# Patient Record
Sex: Female | Born: 1955
Health system: Southern US, Community
[De-identification: ages and names within clinical notes are randomized; demographics above are authoritative.]

## PROBLEM LIST (undated history)

## (undated) DIAGNOSIS — R569 Unspecified convulsions: Secondary | ICD-10-CM

## (undated) DIAGNOSIS — G43909 Migraine, unspecified, not intractable, without status migrainosus: Secondary | ICD-10-CM

## (undated) DIAGNOSIS — J449 Chronic obstructive pulmonary disease, unspecified: Secondary | ICD-10-CM

## (undated) DIAGNOSIS — S098XXA Other specified injuries of head, initial encounter: Secondary | ICD-10-CM

## (undated) HISTORY — DX: Chronic obstructive pulmonary disease, unspecified: J44.9

## (undated) HISTORY — DX: Migraine, unspecified, not intractable, without status migrainosus: G43.909

## (undated) HISTORY — DX: Unspecified convulsions: R56.9

## (undated) HISTORY — PX: ABDOMINAL HYSTERECTOMY: SHX81

## (undated) HISTORY — PX: TONSILLECTOMY: SUR1361

## (undated) HISTORY — DX: Other specified injuries of head, initial encounter: S09.8XXA

---

## 1988-08-02 DIAGNOSIS — S098XXA Other specified injuries of head, initial encounter: Secondary | ICD-10-CM

## 1988-08-02 HISTORY — DX: Other specified injuries of head, initial encounter: S09.8XXA

## 2009-07-28 ENCOUNTER — Ambulatory Visit: Payer: Self-pay | Admitting: Internal Medicine

## 2011-05-01 ENCOUNTER — Ambulatory Visit: Payer: Self-pay | Admitting: Internal Medicine

## 2011-09-18 ENCOUNTER — Emergency Department: Payer: Self-pay | Admitting: Emergency Medicine

## 2012-05-22 ENCOUNTER — Emergency Department: Payer: Self-pay | Admitting: Emergency Medicine

## 2012-05-22 LAB — COMPREHENSIVE METABOLIC PANEL
Albumin: 3.7 g/dL (ref 3.4–5.0)
Anion Gap: 7 (ref 7–16)
Bilirubin,Total: 0.4 mg/dL (ref 0.2–1.0)
Calcium, Total: 8.6 mg/dL (ref 8.5–10.1)
Chloride: 109 mmol/L — ABNORMAL HIGH (ref 98–107)
Co2: 29 mmol/L (ref 21–32)
Creatinine: 0.74 mg/dL (ref 0.60–1.30)
EGFR (African American): >60
EGFR (Non-African Amer.): >60
Glucose: 110 mg/dL — ABNORMAL HIGH (ref 65–99)
Osmolality: 288 (ref 275–301)
Potassium: 4 mmol/L (ref 3.5–5.1)
Sodium: 145 mmol/L (ref 136–145)

## 2012-05-22 LAB — CBC
HGB: 14.2 g/dL (ref 12.0–16.0)
MCH: 30.7 pg (ref 26.0–34.0)
MCHC: 33.5 g/dL (ref 32.0–36.0)
MCV: 92 fL (ref 80–100)
Platelet: 363 10*3/uL (ref 150–440)
RBC: 4.63 10*6/uL (ref 3.80–5.20)
RDW: 13.5 % (ref 11.5–14.5)
WBC: 10.2 10*3/uL (ref 3.6–11.0)

## 2012-05-22 LAB — URINALYSIS, COMPLETE
Bilirubin,UR: NEGATIVE
Blood: NEGATIVE
Glucose,UR: NEGATIVE mg/dL (ref 0–75)
Ketone: NEGATIVE
Leukocyte Esterase: NEGATIVE
Squamous Epithelial: <1

## 2012-05-22 LAB — LIPASE, BLOOD: Lipase: 211 U/L (ref 73–393)

## 2012-06-05 ENCOUNTER — Ambulatory Visit: Payer: Self-pay | Admitting: Gastroenterology

## 2012-06-07 LAB — PATHOLOGY REPORT

## 2012-06-18 ENCOUNTER — Emergency Department: Payer: Self-pay | Admitting: Emergency Medicine

## 2012-06-18 LAB — URINALYSIS, COMPLETE
Blood: NEGATIVE
Glucose,UR: NEGATIVE mg/dL (ref 0–75)
Ketone: NEGATIVE
Nitrite: NEGATIVE
Protein: NEGATIVE
RBC,UR: <1 /HPF (ref 0–5)
Specific Gravity: 1.004 (ref 1.003–1.030)
Squamous Epithelial: <1

## 2012-06-18 LAB — COMPREHENSIVE METABOLIC PANEL
Albumin: 3.8 g/dL (ref 3.4–5.0)
Anion Gap: 5 — ABNORMAL LOW (ref 7–16)
BUN: 8 mg/dL (ref 7–18)
Calcium, Total: 8.9 mg/dL (ref 8.5–10.1)
Co2: 28 mmol/L (ref 21–32)
EGFR (Non-African Amer.): >60
Glucose: 99 mg/dL (ref 65–99)
Potassium: 3.9 mmol/L (ref 3.5–5.1)
SGOT(AST): 21 U/L (ref 15–37)
SGPT (ALT): 19 U/L (ref 12–78)
Total Protein: 6.6 g/dL (ref 6.4–8.2)

## 2012-06-18 LAB — ETHANOL
Ethanol %: <0.003 % (ref 0.000–0.080)
Ethanol: <3 mg/dL

## 2012-06-18 LAB — DRUG SCREEN, URINE
Amphetamines, Ur Screen: NEGATIVE (ref ?–1000)
Benzodiazepine, Ur Scrn: POSITIVE (ref ?–200)
Cannabinoid 50 Ng, Ur ~~LOC~~: NEGATIVE (ref ?–50)
MDMA (Ecstasy)Ur Screen: NEGATIVE (ref ?–500)
Opiate, Ur Screen: NEGATIVE (ref ?–300)
Tricyclic, Ur Screen: NEGATIVE (ref ?–1000)

## 2012-06-18 LAB — CBC
HGB: 13.6 g/dL (ref 12.0–16.0)
MCH: 31.3 pg (ref 26.0–34.0)
MCHC: 34.1 g/dL (ref 32.0–36.0)
Platelet: 317 10*3/uL (ref 150–440)
RBC: 4.35 10*6/uL (ref 3.80–5.20)
RDW: 13.2 % (ref 11.5–14.5)
WBC: 8.6 10*3/uL (ref 3.6–11.0)

## 2012-06-18 LAB — TROPONIN I: Troponin-I: <0.02 ng/mL

## 2013-03-03 ENCOUNTER — Emergency Department: Payer: Self-pay | Admitting: Emergency Medicine

## 2013-03-03 LAB — URINALYSIS, COMPLETE
Glucose,UR: NEGATIVE mg/dL (ref 0–75)
Ketone: NEGATIVE
Leukocyte Esterase: NEGATIVE
Nitrite: NEGATIVE
Ph: 8 (ref 4.5–8.0)
Protein: NEGATIVE
Specific Gravity: 1.003 (ref 1.003–1.030)
Squamous Epithelial: 1
WBC UR: NONE SEEN /HPF (ref 0–5)

## 2013-03-03 LAB — COMPREHENSIVE METABOLIC PANEL
Albumin: 3.6 g/dL (ref 3.4–5.0)
Anion Gap: 5 — ABNORMAL LOW (ref 7–16)
Chloride: 107 mmol/L (ref 98–107)
Co2: 28 mmol/L (ref 21–32)
Creatinine: 0.65 mg/dL (ref 0.60–1.30)
EGFR (African American): >60
EGFR (Non-African Amer.): >60
Glucose: 89 mg/dL (ref 65–99)
Potassium: 3.8 mmol/L (ref 3.5–5.1)
SGOT(AST): 18 U/L (ref 15–37)
Total Protein: 6.9 g/dL (ref 6.4–8.2)

## 2013-03-03 LAB — CBC
HCT: 44.5 % (ref 35.0–47.0)
MCV: 90 fL (ref 80–100)
Platelet: 303 10*3/uL (ref 150–440)
RBC: 4.92 10*6/uL (ref 3.80–5.20)
RDW: 13.2 % (ref 11.5–14.5)
WBC: 14.3 10*3/uL — ABNORMAL HIGH (ref 3.6–11.0)

## 2013-03-07 ENCOUNTER — Inpatient Hospital Stay: Payer: Self-pay | Admitting: Student

## 2013-03-07 LAB — COMPREHENSIVE METABOLIC PANEL
Albumin: 3.6 g/dL (ref 3.4–5.0)
Alkaline Phosphatase: 111 U/L (ref 50–136)
BUN: 8 mg/dL (ref 7–18)
Bilirubin,Total: 0.4 mg/dL (ref 0.2–1.0)
Calcium, Total: 9.5 mg/dL (ref 8.5–10.1)
EGFR (Non-African Amer.): >60
Glucose: 91 mg/dL (ref 65–99)
Osmolality: 272 (ref 275–301)
Potassium: 3.6 mmol/L (ref 3.5–5.1)
SGOT(AST): 16 U/L (ref 15–37)
Sodium: 137 mmol/L (ref 136–145)
Total Protein: 7.3 g/dL (ref 6.4–8.2)

## 2013-03-07 LAB — CBC
HCT: 47.2 % — ABNORMAL HIGH (ref 35.0–47.0)
MCH: 31.4 pg (ref 26.0–34.0)
MCHC: 34.7 g/dL (ref 32.0–36.0)
MCV: 91 fL (ref 80–100)
Platelet: 302 10*3/uL (ref 150–440)
RBC: 5.21 10*6/uL — ABNORMAL HIGH (ref 3.80–5.20)
WBC: 9.5 10*3/uL (ref 3.6–11.0)

## 2013-03-07 LAB — DRUG SCREEN, URINE
Amphetamines, Ur Screen: NEGATIVE (ref ?–1000)
Barbiturates, Ur Screen: NEGATIVE (ref ?–200)
MDMA (Ecstasy)Ur Screen: NEGATIVE (ref ?–500)
Methadone, Ur Screen: NEGATIVE (ref ?–300)
Opiate, Ur Screen: POSITIVE (ref ?–300)

## 2013-03-07 LAB — ETHANOL
Ethanol %: <0.003 % (ref 0.000–0.080)
Ethanol: <3 mg/dL

## 2013-03-07 LAB — URINALYSIS, COMPLETE
Bilirubin,UR: NEGATIVE
Blood: NEGATIVE
Ph: 6 (ref 4.5–8.0)
RBC,UR: 1 /HPF (ref 0–5)
Squamous Epithelial: 1

## 2013-03-07 LAB — VALPROIC ACID LEVEL: Valproic Acid: 5 ug/mL — ABNORMAL LOW

## 2013-03-08 LAB — VALPROIC ACID LEVEL: Valproic Acid: 45 ug/mL — ABNORMAL LOW

## 2013-03-20 ENCOUNTER — Emergency Department: Payer: Self-pay | Admitting: Emergency Medicine

## 2013-03-20 LAB — CBC WITH DIFFERENTIAL/PLATELET
Basophil #: 0.1 10*3/uL (ref 0.0–0.1)
Basophil %: 0.7 %
Eosinophil %: 6.1 %
HCT: 39.2 % (ref 35.0–47.0)
Lymphocyte %: 38 %
MCHC: 34.4 g/dL (ref 32.0–36.0)
Monocyte %: 6.8 %
Neutrophil #: 4.8 10*3/uL (ref 1.4–6.5)
Neutrophil %: 48.4 %
Platelet: 278 10*3/uL (ref 150–440)
RBC: 4.36 10*6/uL (ref 3.80–5.20)
RDW: 12.7 % (ref 11.5–14.5)
WBC: 10 10*3/uL (ref 3.6–11.0)

## 2013-03-20 LAB — BASIC METABOLIC PANEL
Anion Gap: 2 — ABNORMAL LOW (ref 7–16)
BUN: 10 mg/dL (ref 7–18)
Co2: 32 mmol/L (ref 21–32)
Creatinine: 0.64 mg/dL (ref 0.60–1.30)
EGFR (Non-African Amer.): >60
Glucose: 91 mg/dL (ref 65–99)

## 2013-03-20 LAB — VALPROIC ACID LEVEL: Valproic Acid: 59 ug/mL

## 2013-05-23 ENCOUNTER — Ambulatory Visit: Payer: Self-pay | Admitting: Neurology

## 2013-06-20 ENCOUNTER — Emergency Department: Payer: Self-pay | Admitting: Emergency Medicine

## 2013-06-20 LAB — COMPREHENSIVE METABOLIC PANEL
Alkaline Phosphatase: 83 U/L (ref 50–136)
BUN: 4 mg/dL — ABNORMAL LOW (ref 7–18)
Bilirubin,Total: 0.3 mg/dL (ref 0.2–1.0)
Creatinine: 0.62 mg/dL (ref 0.60–1.30)
EGFR (African American): >60
Glucose: 91 mg/dL (ref 65–99)
Osmolality: 268 (ref 275–301)
SGOT(AST): 19 U/L (ref 15–37)
SGPT (ALT): 17 U/L (ref 12–78)
Sodium: 136 mmol/L (ref 136–145)

## 2013-06-20 LAB — CBC WITH DIFFERENTIAL/PLATELET
Basophil #: 0.1 10*3/uL (ref 0.0–0.1)
Basophil %: 0.4 %
Eosinophil #: 0.4 10*3/uL (ref 0.0–0.7)
HCT: 34.8 % — ABNORMAL LOW (ref 35.0–47.0)
Lymphocyte #: 2.8 10*3/uL (ref 1.0–3.6)
MCH: 31 pg (ref 26.0–34.0)
MCHC: 34.4 g/dL (ref 32.0–36.0)
Monocyte #: 1.1 x10 3/mm — ABNORMAL HIGH (ref 0.2–0.9)
Monocyte %: 7.7 %
Neutrophil #: 9.7 10*3/uL — ABNORMAL HIGH (ref 1.4–6.5)
Neutrophil %: 68.8 %
RDW: 13.2 % (ref 11.5–14.5)
WBC: 14 10*3/uL — ABNORMAL HIGH (ref 3.6–11.0)

## 2013-06-29 ENCOUNTER — Ambulatory Visit: Payer: Self-pay

## 2013-07-16 ENCOUNTER — Ambulatory Visit: Payer: Self-pay

## 2013-07-31 ENCOUNTER — Ambulatory Visit: Payer: Self-pay

## 2013-08-21 ENCOUNTER — Ambulatory Visit: Payer: Self-pay

## 2013-09-13 ENCOUNTER — Ambulatory Visit: Payer: Self-pay

## 2013-10-22 ENCOUNTER — Ambulatory Visit: Payer: Self-pay

## 2014-04-16 IMAGING — CT CT ABD-PELV W/ CM
1 of 3 series · 14 of 32 positions shown, 19 images · non-contrast
Comparison: none

REASON FOR EXAM: (1) pain - LLQ, hx of UC and of Diverticulitis; (2) pain
- LLQ, hx of UC and of
COMMENTS:   May transport without cardiac monitor

PROCEDURE:     CT  - CT ABDOMEN / PELVIS  W  - May 22, 2012  [DATE]
RESULT:     History: Pain.
Comparison Study: No recent.

[Series 2: soft tissue · axial · 0.66mm/px · z∈[-893,-530]mm · 14 of 139 slices shown, 19 images]
[im 9/139  soft-tissue]
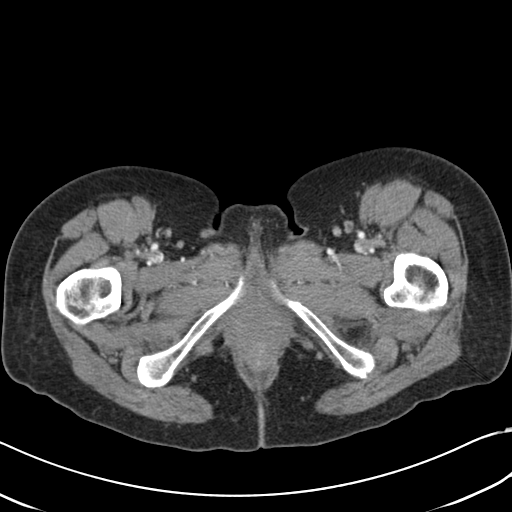
[im 9/139  bone]
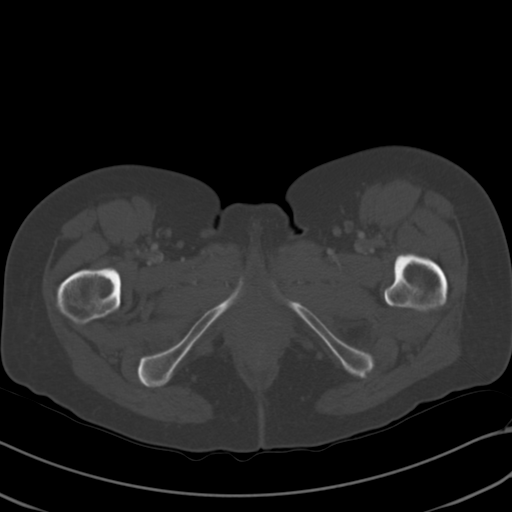
[im 17/139  soft-tissue]
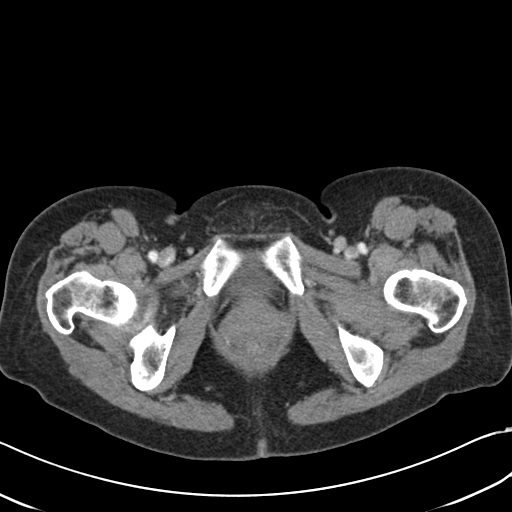
[im 33/139  soft-tissue]
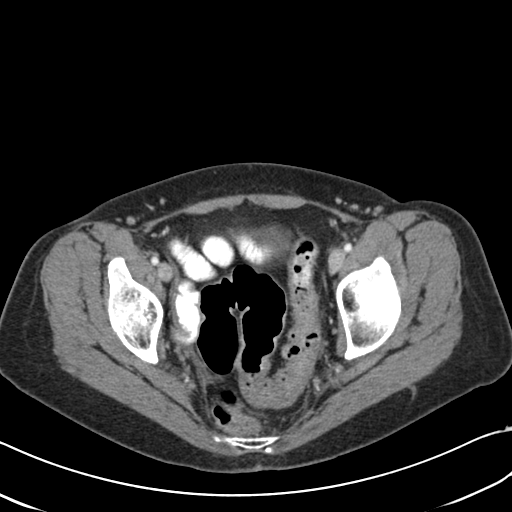
[im 41/139  soft-tissue]
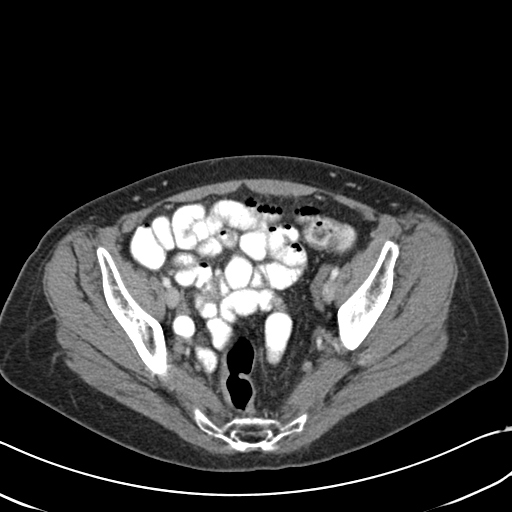
[im 49/139  soft-tissue]
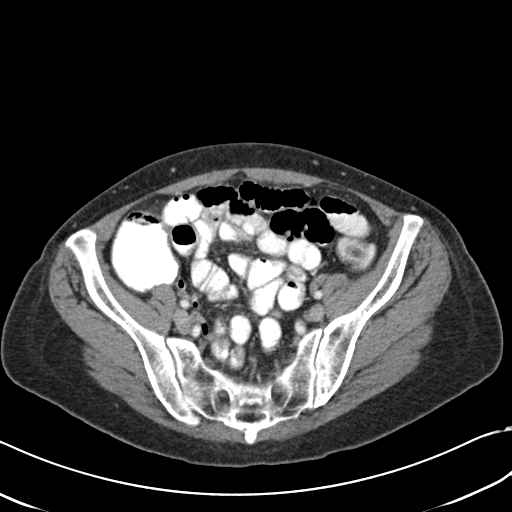
[im 57/139  soft-tissue]
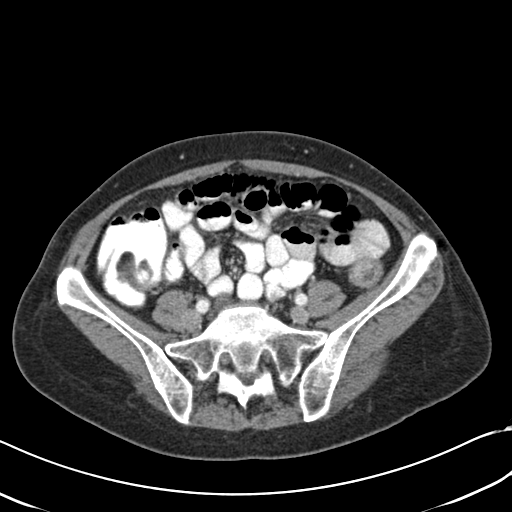
[im 74/139  soft-tissue]
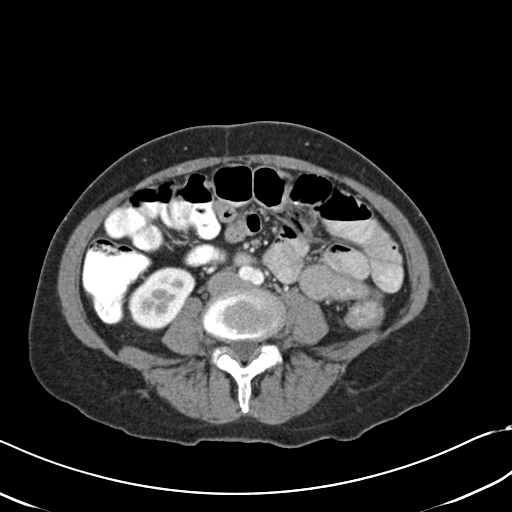
[im 82/139  soft-tissue]
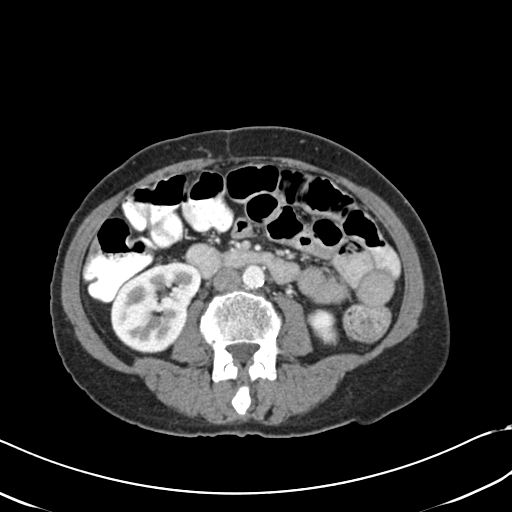
[im 90/139  soft-tissue]
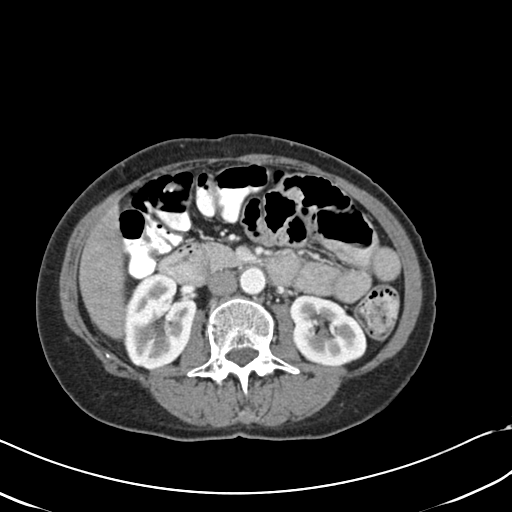
[im 90/139  bone]
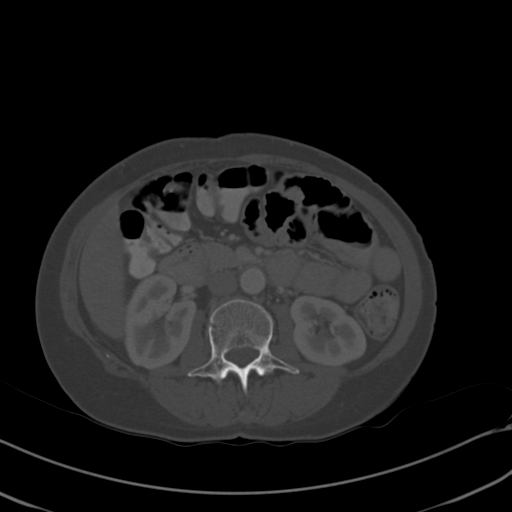
[im 98/139  soft-tissue]
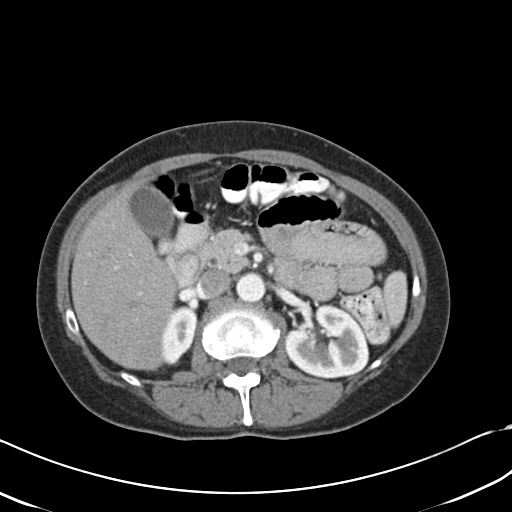
[im 106/139  soft-tissue]
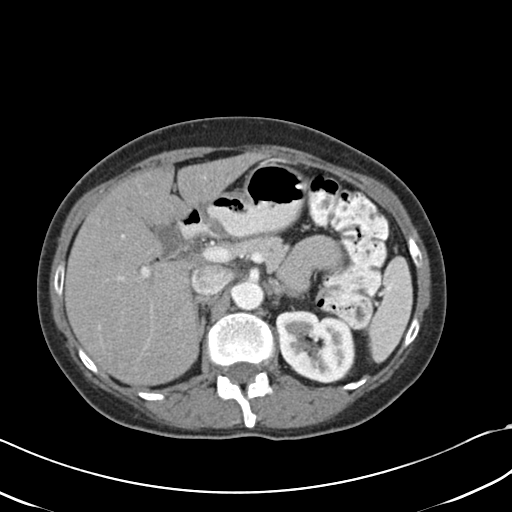
[im 106/139  lung]
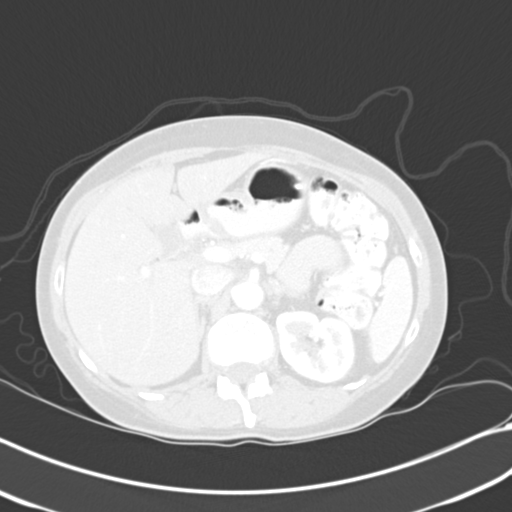
[im 114/139  lung]
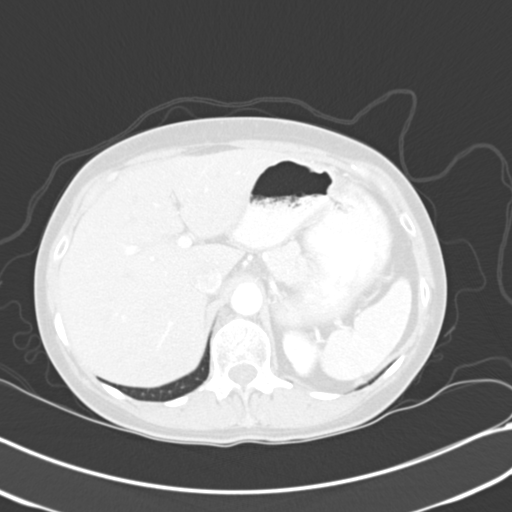
[im 122/139  soft-tissue]
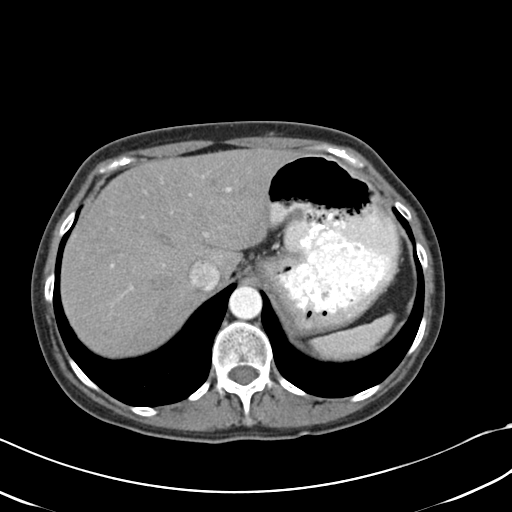
[im 122/139  lung]
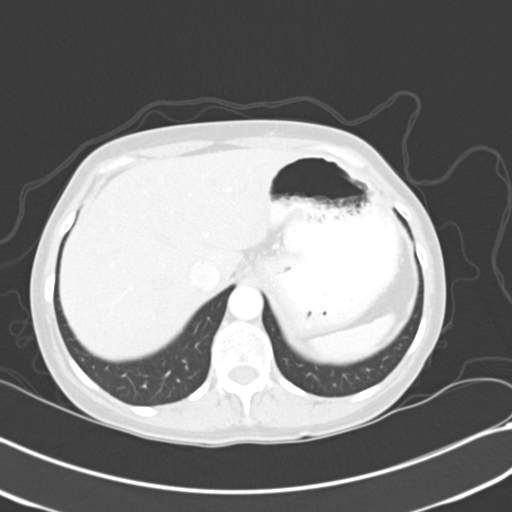
[im 130/139  soft-tissue]
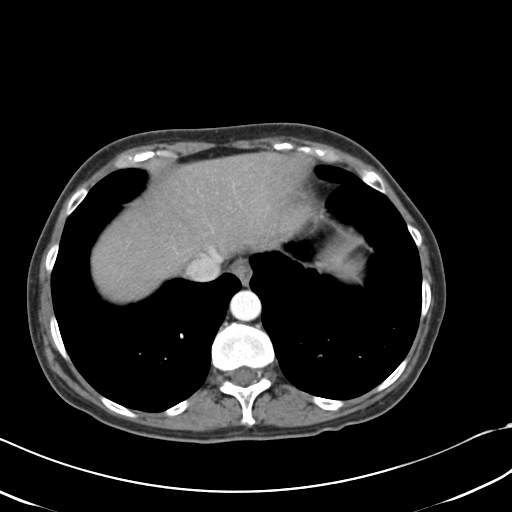
[im 130/139  lung]
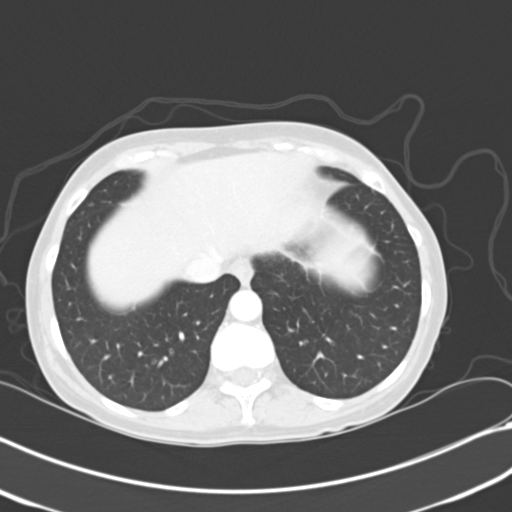

[14 of 32 positions shown; findings below may reference images not displayed]

FINDINGS: Standard CT obtained 85 cc of Isovue 300. Liver normal.
Gallbladder nondistended. Spleen normal. Pancreas normal. Adrenals normal.
Right renal cyst. No hydronephrosis. Bladder slightly distended.
Phleboliths. Mild colonic wall thickening is noted in the sigmoid and left
colon. There is left colonic diverticulosis. Appendix normal. No pathologic
fluid collections are noted. Mild distention of small and large bowel loops
are noted. This may be related to adynamic ileus. No obstructing lesion is
identified. Mesenteric vessels are patent. There is no free air. Lung bases
clear. Aorta unremarkable. Prior hysterectomy.
IMPRESSION: Mild thickening of the left colon and sigmoid colon. Mild
colitis cannot be excluded. Diverticulosis is noted. No mesenteric fat
streaking noted to suggest prominent diverticulitis. There is mild
distention of small bowel and colon suggesting adynamic ileus.

## 2014-11-22 NOTE — H&P (Signed)
PATIENT NAME:  Martha Rivera, Martha Rivera MR#:  [id number] DATE OF BIRTH:  1956/04/27  DATE OF ADMISSION:  03/07/2013  PRIMARY CARE PHYSICIAN: Valetta Close, MD  NEUROLOGIST: None  REFERRING PHYSICIAN: Latina Craver, MD    CHIEF COMPLAINT: Seizures.   HISTORY OF PRESENT ILLNESS: The patient is a pleasant 59 year old Caucasian female with history of ulcerative colitis and a closed head injury 20 years ago or so and resultant seizures. Her seizures, per her, are being taken care of by Dr. Clemmie Krill and she does not see a neurologist but used to see one. Her seizures were less frequent prior but had a seizure a year ago and since the last 6 months has had 2 other seizures besides today. 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 Occasionally she works up to 100 hours per week. She has had some weight loss and has been having anxiety from work. She was at work today and had multiple seizures. EMS was called where she had more seizures at work. She was given some Versed and brought to the hospital where, again, she had some seizures. She was loaded with valproic injection, was also given 2 mg IV of lorazepam. Currently, she is lying in bed and is awake, alert, oriented and is providing history. Her husband is also here. She has had a recent bronchitis and is on her last day of Levaquin. Her cough is persistent, but she is not short of breath, and she has had no fevers.   PAST MEDICAL HISTORY:  1.  Ulcerative colitis.  2.  Closed head injury 20 years ago or so with resultant seizure activity.  3.  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  4.  Hysterectomy.  5.  Neck surgery.   SOCIAL HISTORY:  Positive for tobacco 1/2 pack a day. Drinks beer, 1 to 2 drinks a day and occasionally more over the weekends.  Did also smoke pot.   FAMILY HISTORY: Dad's side of the family with heart issues. Mom's side of the family with colitis issues.   ALLERGIES:  STEROIDS AND AUGMENTIN.  OUTPATIENT MEDICATIONS: Tussionex 5 mL every 12 hours, Depakote Extended Release 250 mg 2 times a day, dicyclomine 10 mg 2 caps 4 times a day as needed, levofloxacin 750 mg, today is her last day, Lexapro 20 mg daily, lorazepam 0.5 mg 1 tab 3 times a day as needed for anxiety and nervousness.   REVIEW OF SYSTEMS: CONSTITUTIONAL: No fever but has overall fatigue and weakness.  Positive for headache.  EYES: No blurry vision or double vision.  ENT: No tinnitus, hearing loss.  RESPIRATORY: Positive for cough and some productive sputum. No hemoptysis. Overall feels better from her bronchitis. No history of COPD. No painful respirations.  CARDIOVASCULAR: No chest pain, orthopnea or swelling in the legs. No hypertension. GASTROINTESTINAL: Has history of ulcerative colitis and chronic abdominal pain but  nothing out of the ordinary this time around. No nausea, vomiting, diarrhea. No bloody stools or dark stools.  GENITOURINARY: Denies dysuria, hematuria.  HEMATOLOGIC/LYMPHATIC: No anemia or easy bruising.  SKIN: No rashes.  MUSCULOSKELETAL: Denies arthritis or gout.  NEUROLOGIC: Seizure activity as above.  Has peripheral neuropathy in upper extremities.  PSYCHIATRIC: No depression but has anxiety.   PHYSICAL EXAMINATION: VITAL SIGNS: Temperature on arrival 97.5, pulse rate 95, respiratory rate 18, blood pressure 16, 97/65, last blood pressure 105/64, O2 sat was 96% on room air.  GENERAL: The patient  is a thin Caucasian female lying in bed in no obvious distress, talking in full sentences.  HEENT: Normocephalic, atraumatic. Pupils are equal and reactive. Anicteric sclerae.  Extraocular muscles intact. Moist mucous membranes.  NECK: Supple. No thyroid tenderness. No cervical lymphadenopathy.  CARDIOVASCULAR: S1, S2, regular rate and rhythm. No murmurs, rubs or gallops.  LUNGS: Clear to auscultation. No wheezing, rhonchi or rales.  ABDOMEN: Soft. Hypoactive bowel sounds in all  quadrants. No organomegaly appreciated. No tenderness or rebound or guarding.  EXTREMITIES: No significant lower extremity edema. There are some tattoos on the skin.   NEUROLOGIC: Cranial nerves II through XII grossly intact. Strength is 5 out of 5 in all extremities. Sensation is intact to light touch.  PSYCHIATRIC: Awake, alert, oriented x 3.   LABORATORY AND DIAGNOSTIC DATA:  BUN 8, creatinine 0.77, sodium 137, potassium 3.6. LFTs within normal limits. Alcohol level below detection. Valproic acid level is 5. U-tox positive for benzos, cannabinoids and opiates. WBC 9.5, hemoglobin 16.4, platelets are 302. UA not suggestive of infection.  EKG: Normal sinus rhythm, possible left atrial enlargement. No acute ST elevations or depressions. Rate is 96, next one 93.   ASSESSMENT AND PLAN: We have a 59 year old with seizures, status post closed head injury, ulcerative colitis who has had more frequent seizures recently with 3 in the last 6 months. The patient, although takes Depakote, the level is low. At this point, would admit the patient to the hospital.  Her seizures and status epilepticus likely are triggered by low levels in the body. She states that she misses her medications 1 to 2 times a week. Also, I believe her stress related to excessive working, her alcohol and marijuana use might also have contributed. At this point, she is neurologically intact. We would increase the Depakote level and add Keppra, obtain a neurology consult, check an EEG. She was counseled that she cannot drive until she is cleared by Neurology. We would also obtain an MRI of the brain. We would place her on seizure precautions and also add Ativan IV p.r.n. for seizure activity. We would consider rechecking the Depakote level. She, of note, has a bronchitis but the symptoms are resolving except for a persistent cough. She has no fever, no shortness of breath. She has no significant electrolyte abnormalities. She was counseled  about her tobacco abuse for 3 minutes in addition to her alcohol abuse and pot use. We would continue her Lexapro and start her on some gentle IV fluids at this point.   TOTAL TIME SPENT: 50 minutes.   CODE STATUS: The patient is FULL CODE.   ____________________________ [name], MD sa:cb D: 03/07/2013 15:53:44 ET T: 03/07/2013 16:11:18 ET JOB#: [id number]  cc: [name], MD, <Dictator> [name] MD ELECTRONICALLY SIGNED 03/15/2013 13:30

## 2014-11-22 NOTE — Discharge Summary (Signed)
PATIENT NAME:  Martha Rivera, CORTESE MR#:  [id number] DATE OF BIRTH:  March 02, 1956  PRIMARY CARE PHYSICIAN: Dr. Bernie Covey.   CHIEF COMPLAINT: Seizure.   DISCHARGE DIAGNOSES:  1.  Status epilepticus, likely multifactorial in the setting of elevated stress, work and personal life-related, recent infection, and not taking Depakote as prescribed with ethanol abuse.  2.  History of seizure disorder.  3.  Ulcerative colitis, chronic.  4.  Recent bronchitis.  5.  History of neuropathy in the upper extremities.  6.  History of closed head injury with resultant seizure activity more than two decades ago.   DISCHARGE MEDICATIONS: Lexapro 20 mg daily, lorazepam 0.5 mg three times a day as needed for anxiety, dicyclomine 10 mg two caps 4 times a day as needed, Divalproex  500 mg delayed release one tab 2 times a day with meals, Keppra 500 mg one tab every 12 hours.   DIET: Regular.   ACTIVITY: As tolerated.   FOLLOWUP: Please follow with PCP within 1 to 2 weeks. Please follow with Dr. Melrose Nakayama at [facility] neurology August 25 at 1:00 p.m. No driving until approved by her neurologist.   DISPOSITION: Home.   SIGNIFICANT LABS AND IMAGING: On August 6, BMP was within normal limits. Alcohol level was less than 0.003%. LFTs are within normal limits. Initial valproic acid level was at 5 and last valproic acid serum was at 45. Urine drug screen was positive for benzodiazepines, cannabinoids, and opiates. Initial WBC was 9.5. Hemoglobin 16.4, platelets of 302. Urinalysis not suggestive of infection. MRI of the brain without contrast showed no acute intracranial abnormality.   HISTORY OF PRESENT ILLNESS AND HOSPITAL COURSE: For full details of H and P, please see the dictation on August 6 by Dr. Bridgette Habermann. In brief, this is a Martha 59 year old Rivera with history of seizures after a closed head injury who is being followed up with Dr. Bernie Covey. She has not seen a neurologist where she was seeing one  before.   She presented with having seizures at work. EMS was called and she had more seizures in the ambulance. She received some Versed and came into the hospital, where again she was having seizure activity and was loaded with Depakote and admitted to the hospitalist service.   By the time I had seen the patient, she was awake, alert, oriented x3 and providing history. The patient had a recent bronchitis and was on Levaquin and some cough medicine. The patient was having to go to work about 100 hours per week six days a week and has a couple of days off every multiple weeks. The patient also described having a stress from a close personal friend who is terminal from metastatic lung cancer.   She had been skipping some of her medications and also has been using alcohol. She came in for the above reason. She was admitted to the hospitalist service with seizure precautions. Her Depakote level was not therapeutic, likely secondary to noncompliance. She was neurologically intact. She underwent an EEG which did not show a seizure focus and underwent an MRI.   We unfortunately do not have neurology coverage this week and have made an appointment for her to see a neurologist as dictated above. She was counseled about tobacco and her alcohol abuse. At this point, she will be discharged with outpatient follow-up.   She was counseled not to drive until approved by her neurologist and counseled against smoking and alcohol use as they could be triggering her seizures  in addition to all the stress that she is having. She was counseled to take her medications as prescribed.   Total time spent is 40 minutes.   ____________________________ [name], MD sa:np D: 03/09/2013 13:39:40 ET T: 03/09/2013 15:21:50 ET JOB#: [id number]  cc: [name], MD, <Dictator> Valetta Close, MD [name] MD ELECTRONICALLY SIGNED 03/15/2013 13:36

## 2015-01-26 IMAGING — CR DG CHEST 2V
1 series · 2 of 2 positions shown · non-contrast
Comparison: none

REASON FOR EXAM: SOB
COMMENTS:

PROCEDURE:     DXR - DXR CHEST PA (OR AP) AND LATERAL  - March 03, 2013  [DATE]
RESULT:     The lungs are clear. The cardiac silhouette and visualized bony
skeleton are unremarkable.

[Series 1: w chest pa · 0.14mm/px · 2 of 2 slices shown]
[im 1/2]
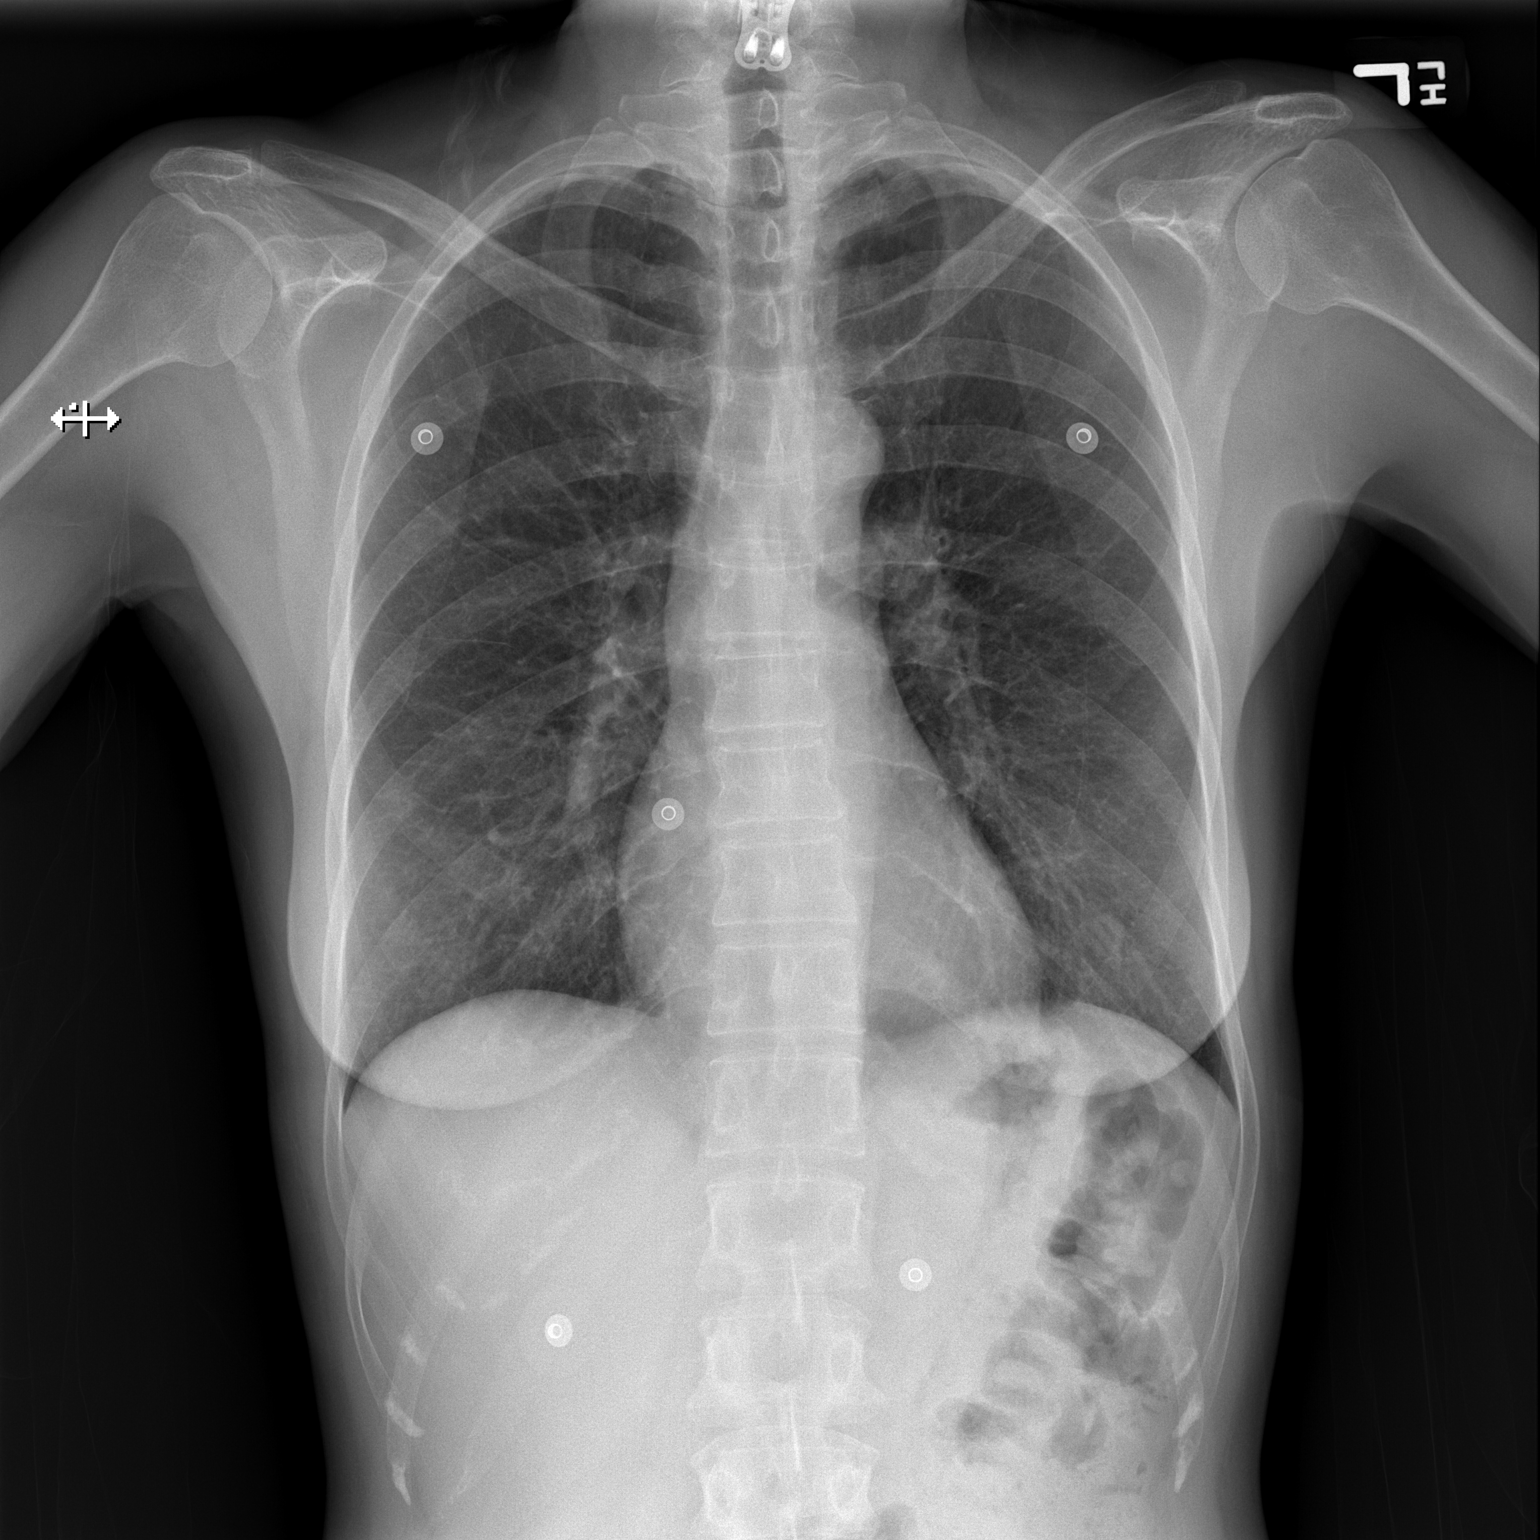
[im 2/2]
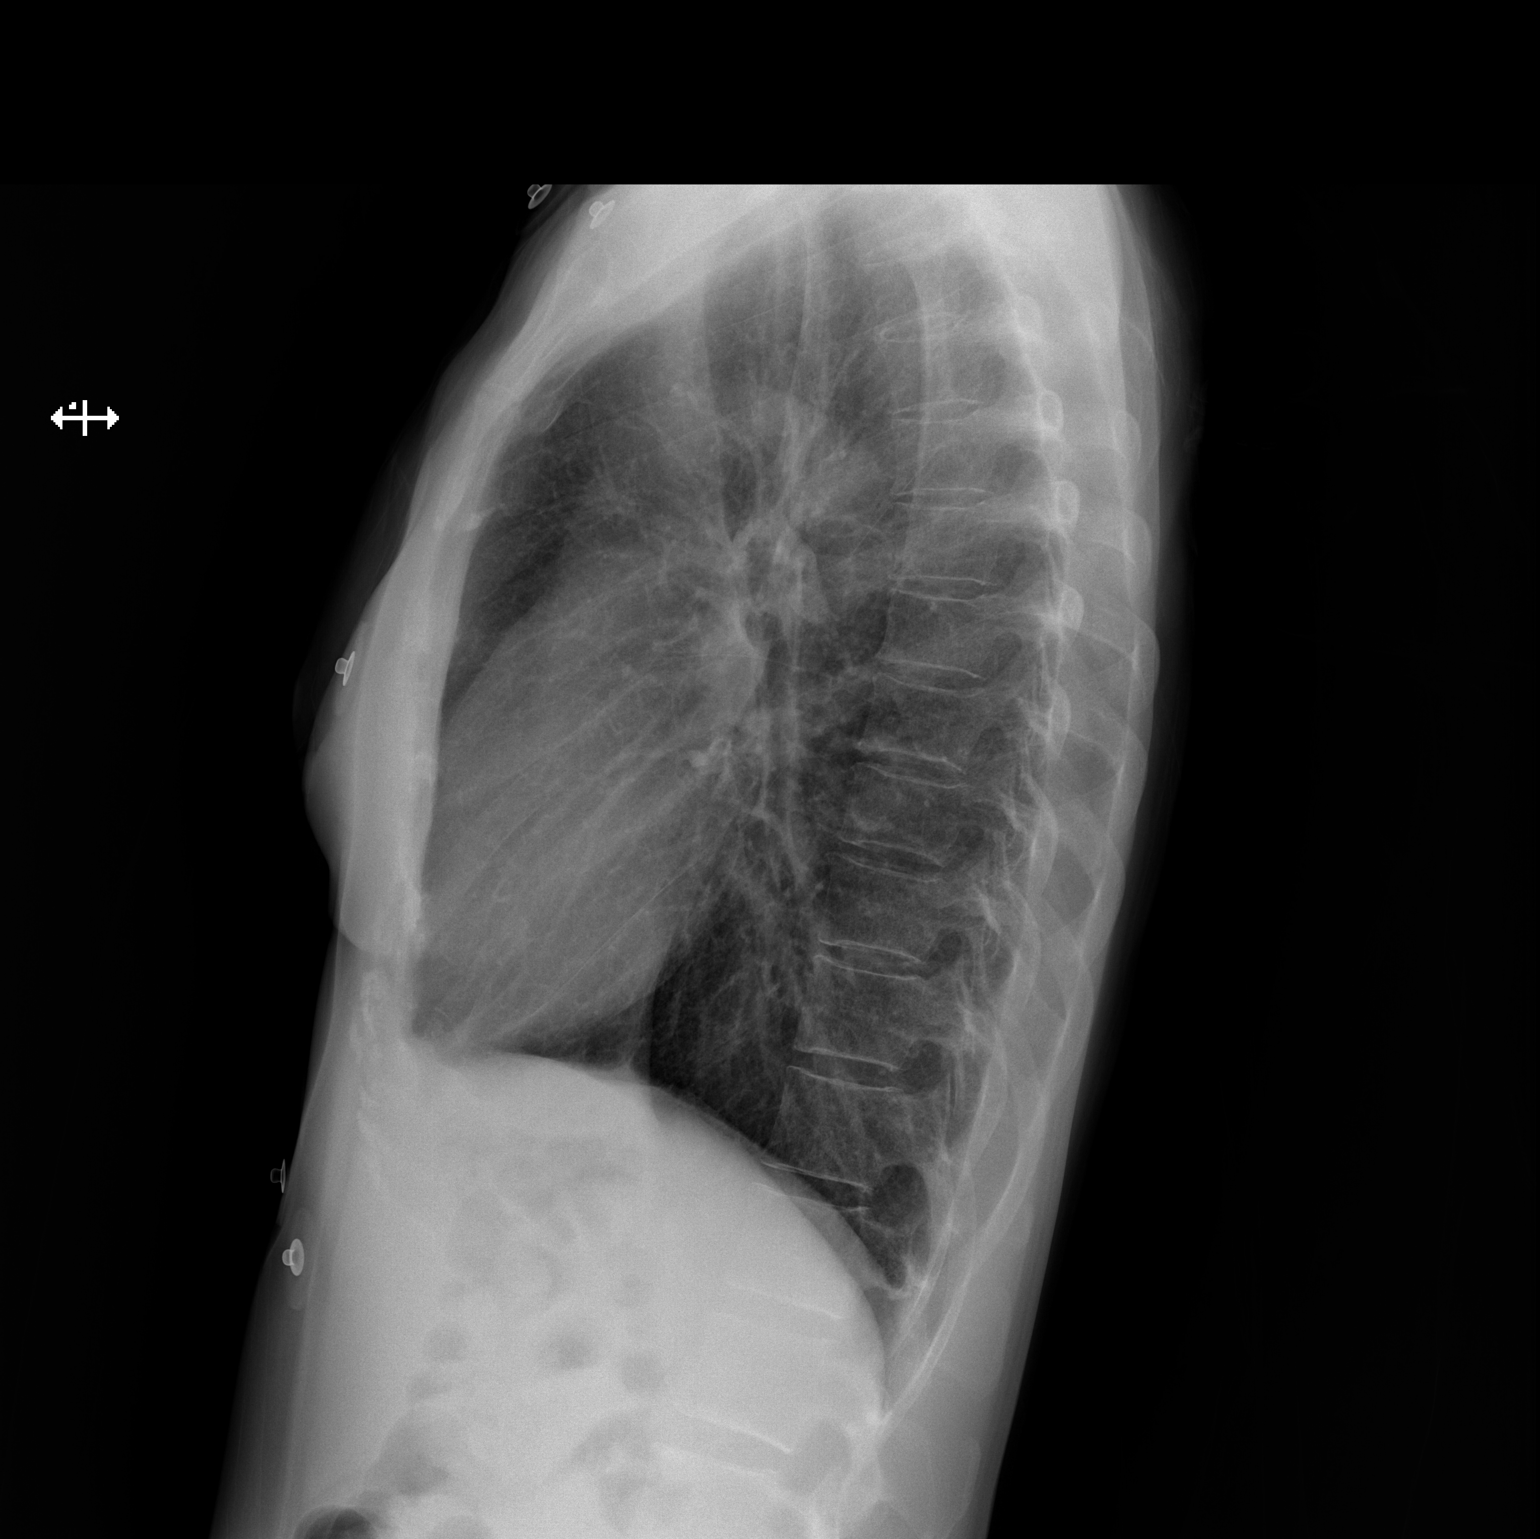

[2 of 2 positions shown; findings below may reference images not displayed]

IMPRESSION: 1. Chest radiograph without evidence of acute cardiopulmonary disease.
2. Comparison made to prior study dated 06/18/2012.

## 2015-10-08 DIAGNOSIS — F419 Anxiety disorder, unspecified: Secondary | ICD-10-CM | POA: Diagnosis not present

## 2015-10-08 DIAGNOSIS — G40909 Epilepsy, unspecified, not intractable, without status epilepticus: Secondary | ICD-10-CM | POA: Diagnosis not present

## 2015-10-08 DIAGNOSIS — G43119 Migraine with aura, intractable, without status migrainosus: Secondary | ICD-10-CM | POA: Diagnosis not present

## 2015-10-08 DIAGNOSIS — R569 Unspecified convulsions: Secondary | ICD-10-CM | POA: Diagnosis not present

## 2015-10-08 DIAGNOSIS — G43719 Chronic migraine without aura, intractable, without status migrainosus: Secondary | ICD-10-CM | POA: Diagnosis not present

## 2015-11-06 ENCOUNTER — Encounter: Payer: Self-pay | Admitting: Obstetrics & Gynecology

## 2015-11-06 ENCOUNTER — Ambulatory Visit (INDEPENDENT_AMBULATORY_CARE_PROVIDER_SITE_OTHER): Payer: Medicare Other | Admitting: Obstetrics & Gynecology

## 2015-11-06 VITALS — BP 109/70 | HR 79 | Resp 18 | Ht 64.0 in | Wt 115.0 lb

## 2015-11-06 DIAGNOSIS — N8110 Cystocele, unspecified: Secondary | ICD-10-CM | POA: Diagnosis not present

## 2015-11-06 DIAGNOSIS — N952 Postmenopausal atrophic vaginitis: Secondary | ICD-10-CM | POA: Diagnosis not present

## 2015-11-06 DIAGNOSIS — N393 Stress incontinence (female) (male): Secondary | ICD-10-CM | POA: Insufficient documentation

## 2015-11-06 DIAGNOSIS — IMO0002 Reserved for concepts with insufficient information to code with codable children: Secondary | ICD-10-CM

## 2015-11-06 MED ORDER — ESTRADIOL 10 MCG VA TABS: ORAL_TABLET | VAGINAL | Status: DC

## 2015-11-06 NOTE — Progress Notes (Signed)
   Subjective:    Patient ID: Martha Rivera, female    DOB: 1956/02/15, 60 y.o.   MRN: [id number]  HPI  60 yo MW P1 here with a 1 month h/o feeling a lot of vaginal discomfort "like there is a tampax in me falling out". She also has pain with sex but this is complicated by her husband's ED. They are rarely sexually active. She also has a year h/o GSUI, denies urge incontinence. She wears pads daily.   Review of Systems She had a TVH/BSO in 2002. 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    Objective:   Physical Exam  WNWNWFNAD Breathing with some audible difficulty (has COPD) Abd- benign VERY small vagina Grade 1 cystocele Severe atrophy      Assessment & Plan:  GSUI- she declines a pessary and is interested in a sling Atrophy- vagifem QOD until I evaluate her in a month Grade 1 cystocele- We discussed a possible cystocele repair but I did caution her that it will make her vagina even smaller.

## 2015-11-06 NOTE — Progress Notes (Signed)
Pt had hysterectomy in 2002, c/o possible bladder prolapse and urinary stress incontinence.

## 2015-11-10 ENCOUNTER — Telehealth: Payer: Self-pay | Admitting: *Deleted

## 2015-11-10 DIAGNOSIS — N952 Postmenopausal atrophic vaginitis: Secondary | ICD-10-CM

## 2015-11-10 MED ORDER — ESTROGENS, CONJUGATED 0.625 MG/GM VA CREA: TOPICAL_CREAM | VAGINAL | Status: AC

## 2015-11-10 NOTE — Telephone Encounter (Signed)
-----   Message from Francia Greaves sent at 11/07/2015  9:26 AM EDT ----- Regarding: Rx Swap  Contact: [phone] Pt called and states that the medicine [facility] prescribed her is over $500 (w/ her discount card), she has Medicare. Wants to know if there is a cheaper option we could give her

## 2015-11-10 NOTE — Telephone Encounter (Signed)
Pt spoke to pharmacy and pharmacy tech stated there was a cream that she could use that would be cheaper than the Estradiol.  Dr Kennon Rounds ordered Premarin cream every other day.  Rx sent to pharmacy, instructed pt on medication use.

## 2015-12-02 ENCOUNTER — Ambulatory Visit (INDEPENDENT_AMBULATORY_CARE_PROVIDER_SITE_OTHER): Payer: Medicare Other | Admitting: Obstetrics & Gynecology

## 2015-12-02 ENCOUNTER — Encounter: Payer: Self-pay | Admitting: Obstetrics & Gynecology

## 2015-12-02 DIAGNOSIS — N8110 Cystocele, unspecified: Secondary | ICD-10-CM

## 2015-12-02 DIAGNOSIS — N952 Postmenopausal atrophic vaginitis: Secondary | ICD-10-CM

## 2015-12-02 DIAGNOSIS — IMO0002 Reserved for concepts with insufficient information to code with codable children: Secondary | ICD-10-CM

## 2015-12-02 NOTE — Progress Notes (Signed)
   Subjective:    Patient ID: Martha Rivera, female    DOB: 01/09/1956, 60 y.o.   MRN: [id number]  HPI 60 yo lady is here for follow up since starting vaginal estrogen. She says that her symptoms have in general all improved. She still feels some discomfort when straining for a BM. She has not had sex since her last visit. She prefers the vagifem and will be sending up paperwork so that she can get it for $85 per month.   Review of Systems She is planning to move to [place] next month but she still plans to be seen here.    Objective:   Physical Exam WNWHWFNAD Breathing, conversing, and ambulating normally Abd- benign Vagina/vulva- much improved with regard to the atrophy Her vagina is only large enough to tolerate my index finger. Her cystocele is minimal.      Assessment & Plan:  Atrophy- doing well with vaginal estrogen RTC 1 year/prn sooner

## 2016-03-23 DIAGNOSIS — G40909 Epilepsy, unspecified, not intractable, without status epilepticus: Secondary | ICD-10-CM | POA: Diagnosis not present

## 2016-03-23 DIAGNOSIS — J441 Chronic obstructive pulmonary disease with (acute) exacerbation: Secondary | ICD-10-CM | POA: Diagnosis not present

## 2016-04-01 DIAGNOSIS — F419 Anxiety disorder, unspecified: Secondary | ICD-10-CM | POA: Diagnosis not present

## 2016-04-01 DIAGNOSIS — Z8659 Personal history of other mental and behavioral disorders: Secondary | ICD-10-CM | POA: Diagnosis not present

## 2016-04-01 DIAGNOSIS — G40909 Epilepsy, unspecified, not intractable, without status epilepticus: Secondary | ICD-10-CM | POA: Diagnosis not present

## 2016-04-01 DIAGNOSIS — Z7689 Persons encountering health services in other specified circumstances: Secondary | ICD-10-CM | POA: Diagnosis not present

## 2016-04-01 DIAGNOSIS — Z1231 Encounter for screening mammogram for malignant neoplasm of breast: Secondary | ICD-10-CM | POA: Diagnosis not present

## 2016-04-01 DIAGNOSIS — Z1211 Encounter for screening for malignant neoplasm of colon: Secondary | ICD-10-CM | POA: Diagnosis not present

## 2016-04-01 DIAGNOSIS — G43111 Migraine with aura, intractable, with status migrainosus: Secondary | ICD-10-CM | POA: Diagnosis not present

## 2016-04-01 DIAGNOSIS — J449 Chronic obstructive pulmonary disease, unspecified: Secondary | ICD-10-CM | POA: Diagnosis not present

## 2016-04-23 DIAGNOSIS — Z1231 Encounter for screening mammogram for malignant neoplasm of breast: Secondary | ICD-10-CM | POA: Diagnosis not present

## 2016-05-06 DIAGNOSIS — K5180 Other ulcerative colitis without complications: Secondary | ICD-10-CM | POA: Diagnosis not present

## 2016-05-06 DIAGNOSIS — R1310 Dysphagia, unspecified: Secondary | ICD-10-CM | POA: Diagnosis not present

## 2016-05-06 DIAGNOSIS — R1013 Epigastric pain: Secondary | ICD-10-CM | POA: Diagnosis not present

## 2016-05-12 DIAGNOSIS — Z5181 Encounter for therapeutic drug level monitoring: Secondary | ICD-10-CM | POA: Diagnosis not present

## 2016-05-12 DIAGNOSIS — K5180 Other ulcerative colitis without complications: Secondary | ICD-10-CM | POA: Diagnosis not present

## 2016-05-12 DIAGNOSIS — K529 Noninfective gastroenteritis and colitis, unspecified: Secondary | ICD-10-CM | POA: Diagnosis not present

## 2016-05-17 DIAGNOSIS — R1310 Dysphagia, unspecified: Secondary | ICD-10-CM | POA: Diagnosis not present

## 2016-05-17 DIAGNOSIS — J449 Chronic obstructive pulmonary disease, unspecified: Secondary | ICD-10-CM | POA: Diagnosis not present

## 2016-05-17 DIAGNOSIS — R1032 Left lower quadrant pain: Secondary | ICD-10-CM | POA: Diagnosis not present

## 2016-05-17 DIAGNOSIS — D120 Benign neoplasm of cecum: Secondary | ICD-10-CM | POA: Diagnosis not present

## 2016-05-17 DIAGNOSIS — K210 Gastro-esophageal reflux disease with esophagitis: Secondary | ICD-10-CM | POA: Diagnosis not present

## 2016-05-17 DIAGNOSIS — Z87891 Personal history of nicotine dependence: Secondary | ICD-10-CM | POA: Diagnosis not present

## 2016-05-17 DIAGNOSIS — K317 Polyp of stomach and duodenum: Secondary | ICD-10-CM | POA: Diagnosis not present

## 2016-05-17 DIAGNOSIS — K5190 Ulcerative colitis, unspecified, without complications: Secondary | ICD-10-CM | POA: Diagnosis not present

## 2016-05-17 DIAGNOSIS — R1013 Epigastric pain: Secondary | ICD-10-CM | POA: Diagnosis not present

## 2016-05-17 DIAGNOSIS — K2950 Unspecified chronic gastritis without bleeding: Secondary | ICD-10-CM | POA: Diagnosis not present

## 2016-05-17 DIAGNOSIS — R197 Diarrhea, unspecified: Secondary | ICD-10-CM | POA: Diagnosis not present

## 2016-05-17 DIAGNOSIS — K5180 Other ulcerative colitis without complications: Secondary | ICD-10-CM | POA: Diagnosis not present

## 2016-06-07 DIAGNOSIS — F419 Anxiety disorder, unspecified: Secondary | ICD-10-CM | POA: Diagnosis not present

## 2016-06-07 DIAGNOSIS — Z8659 Personal history of other mental and behavioral disorders: Secondary | ICD-10-CM | POA: Diagnosis not present

## 2016-06-07 DIAGNOSIS — G40909 Epilepsy, unspecified, not intractable, without status epilepticus: Secondary | ICD-10-CM | POA: Diagnosis not present

## 2016-06-25 DIAGNOSIS — G40909 Epilepsy, unspecified, not intractable, without status epilepticus: Secondary | ICD-10-CM | POA: Diagnosis not present

## 2016-06-25 DIAGNOSIS — S0990XS Unspecified injury of head, sequela: Secondary | ICD-10-CM | POA: Diagnosis not present

## 2016-07-05 DIAGNOSIS — G40909 Epilepsy, unspecified, not intractable, without status epilepticus: Secondary | ICD-10-CM | POA: Diagnosis not present

## 2016-07-05 DIAGNOSIS — Z8669 Personal history of other diseases of the nervous system and sense organs: Secondary | ICD-10-CM | POA: Diagnosis not present

## 2016-08-06 DIAGNOSIS — K219 Gastro-esophageal reflux disease without esophagitis: Secondary | ICD-10-CM | POA: Diagnosis not present

## 2016-08-06 DIAGNOSIS — K581 Irritable bowel syndrome with constipation: Secondary | ICD-10-CM | POA: Diagnosis not present

## 2016-08-19 DIAGNOSIS — G43719 Chronic migraine without aura, intractable, without status migrainosus: Secondary | ICD-10-CM | POA: Diagnosis not present

## 2016-08-19 DIAGNOSIS — G40209 Localization-related (focal) (partial) symptomatic epilepsy and epileptic syndromes with complex partial seizures, not intractable, without status epilepticus: Secondary | ICD-10-CM | POA: Diagnosis not present

## 2016-08-19 DIAGNOSIS — G43019 Migraine without aura, intractable, without status migrainosus: Secondary | ICD-10-CM | POA: Diagnosis not present

## 2016-08-19 DIAGNOSIS — G40309 Generalized idiopathic epilepsy and epileptic syndromes, not intractable, without status epilepticus: Secondary | ICD-10-CM | POA: Diagnosis not present

## 2016-09-10 DIAGNOSIS — G40209 Localization-related (focal) (partial) symptomatic epilepsy and epileptic syndromes with complex partial seizures, not intractable, without status epilepticus: Secondary | ICD-10-CM | POA: Diagnosis not present

## 2016-09-10 DIAGNOSIS — G40309 Generalized idiopathic epilepsy and epileptic syndromes, not intractable, without status epilepticus: Secondary | ICD-10-CM | POA: Diagnosis not present

## 2016-09-23 DIAGNOSIS — G40309 Generalized idiopathic epilepsy and epileptic syndromes, not intractable, without status epilepticus: Secondary | ICD-10-CM | POA: Diagnosis not present

## 2016-09-23 DIAGNOSIS — G40209 Localization-related (focal) (partial) symptomatic epilepsy and epileptic syndromes with complex partial seizures, not intractable, without status epilepticus: Secondary | ICD-10-CM | POA: Diagnosis not present

## 2016-09-23 DIAGNOSIS — G43019 Migraine without aura, intractable, without status migrainosus: Secondary | ICD-10-CM | POA: Diagnosis not present

## 2016-09-23 DIAGNOSIS — G43719 Chronic migraine without aura, intractable, without status migrainosus: Secondary | ICD-10-CM | POA: Diagnosis not present

## 2016-09-27 DIAGNOSIS — R6889 Other general symptoms and signs: Secondary | ICD-10-CM | POA: Diagnosis not present

## 2016-09-27 DIAGNOSIS — R0602 Shortness of breath: Secondary | ICD-10-CM | POA: Diagnosis not present

## 2016-09-27 DIAGNOSIS — R509 Fever, unspecified: Secondary | ICD-10-CM | POA: Diagnosis not present

## 2016-09-29 DIAGNOSIS — J189 Pneumonia, unspecified organism: Secondary | ICD-10-CM | POA: Diagnosis not present

## 2016-09-29 DIAGNOSIS — R0600 Dyspnea, unspecified: Secondary | ICD-10-CM | POA: Diagnosis not present

## 2016-09-29 DIAGNOSIS — R0789 Other chest pain: Secondary | ICD-10-CM | POA: Diagnosis not present

## 2016-09-29 DIAGNOSIS — R0602 Shortness of breath: Secondary | ICD-10-CM | POA: Diagnosis not present

## 2016-09-29 DIAGNOSIS — J441 Chronic obstructive pulmonary disease with (acute) exacerbation: Secondary | ICD-10-CM | POA: Diagnosis not present

## 2016-09-29 DIAGNOSIS — J09X2 Influenza due to identified novel influenza A virus with other respiratory manifestations: Secondary | ICD-10-CM | POA: Diagnosis not present

## 2016-09-29 DIAGNOSIS — J111 Influenza due to unidentified influenza virus with other respiratory manifestations: Secondary | ICD-10-CM | POA: Diagnosis not present

## 2016-09-29 DIAGNOSIS — R110 Nausea: Secondary | ICD-10-CM | POA: Diagnosis not present

## 2016-09-29 DIAGNOSIS — R05 Cough: Secondary | ICD-10-CM | POA: Diagnosis not present

## 2016-09-29 DIAGNOSIS — R509 Fever, unspecified: Secondary | ICD-10-CM | POA: Diagnosis not present

## 2016-10-05 DIAGNOSIS — K219 Gastro-esophageal reflux disease without esophagitis: Secondary | ICD-10-CM | POA: Diagnosis not present

## 2016-10-05 DIAGNOSIS — G40909 Epilepsy, unspecified, not intractable, without status epilepticus: Secondary | ICD-10-CM | POA: Diagnosis not present

## 2016-10-05 DIAGNOSIS — F418 Other specified anxiety disorders: Secondary | ICD-10-CM | POA: Diagnosis not present

## 2016-10-05 DIAGNOSIS — J449 Chronic obstructive pulmonary disease, unspecified: Secondary | ICD-10-CM | POA: Diagnosis not present

## 2016-11-01 DIAGNOSIS — F4310 Post-traumatic stress disorder, unspecified: Secondary | ICD-10-CM | POA: Diagnosis not present

## 2016-11-02 DIAGNOSIS — Z23 Encounter for immunization: Secondary | ICD-10-CM | POA: Diagnosis not present

## 2016-11-02 DIAGNOSIS — R5383 Other fatigue: Secondary | ICD-10-CM | POA: Diagnosis not present

## 2016-11-02 DIAGNOSIS — K219 Gastro-esophageal reflux disease without esophagitis: Secondary | ICD-10-CM | POA: Diagnosis not present

## 2016-11-02 DIAGNOSIS — J449 Chronic obstructive pulmonary disease, unspecified: Secondary | ICD-10-CM | POA: Diagnosis not present

## 2016-11-02 DIAGNOSIS — G40909 Epilepsy, unspecified, not intractable, without status epilepticus: Secondary | ICD-10-CM | POA: Diagnosis not present

## 2016-11-02 DIAGNOSIS — Z8249 Family history of ischemic heart disease and other diseases of the circulatory system: Secondary | ICD-10-CM | POA: Diagnosis not present

## 2016-11-02 DIAGNOSIS — F418 Other specified anxiety disorders: Secondary | ICD-10-CM | POA: Diagnosis not present

## 2016-11-02 DIAGNOSIS — Z79899 Other long term (current) drug therapy: Secondary | ICD-10-CM | POA: Diagnosis not present

## 2016-11-02 DIAGNOSIS — E559 Vitamin D deficiency, unspecified: Secondary | ICD-10-CM | POA: Diagnosis not present

## 2016-11-22 DIAGNOSIS — K5900 Constipation, unspecified: Secondary | ICD-10-CM | POA: Diagnosis not present

## 2016-11-22 DIAGNOSIS — K589 Irritable bowel syndrome without diarrhea: Secondary | ICD-10-CM | POA: Diagnosis not present

## 2016-11-22 DIAGNOSIS — K581 Irritable bowel syndrome with constipation: Secondary | ICD-10-CM | POA: Diagnosis not present

## 2016-11-22 DIAGNOSIS — F418 Other specified anxiety disorders: Secondary | ICD-10-CM | POA: Diagnosis not present

## 2016-11-26 DIAGNOSIS — K581 Irritable bowel syndrome with constipation: Secondary | ICD-10-CM | POA: Diagnosis not present

## 2016-12-02 DIAGNOSIS — F4310 Post-traumatic stress disorder, unspecified: Secondary | ICD-10-CM | POA: Diagnosis not present

## 2016-12-10 DIAGNOSIS — G40909 Epilepsy, unspecified, not intractable, without status epilepticus: Secondary | ICD-10-CM | POA: Diagnosis not present

## 2016-12-10 DIAGNOSIS — F418 Other specified anxiety disorders: Secondary | ICD-10-CM | POA: Diagnosis not present

## 2016-12-10 DIAGNOSIS — F4310 Post-traumatic stress disorder, unspecified: Secondary | ICD-10-CM | POA: Diagnosis not present

## 2017-01-05 DIAGNOSIS — F4310 Post-traumatic stress disorder, unspecified: Secondary | ICD-10-CM | POA: Diagnosis not present

## 2017-01-26 DIAGNOSIS — F4310 Post-traumatic stress disorder, unspecified: Secondary | ICD-10-CM | POA: Diagnosis not present

## 2017-01-26 DIAGNOSIS — F418 Other specified anxiety disorders: Secondary | ICD-10-CM | POA: Diagnosis not present

## 2017-02-08 DIAGNOSIS — F4310 Post-traumatic stress disorder, unspecified: Secondary | ICD-10-CM | POA: Diagnosis not present

## 2017-02-25 DIAGNOSIS — F5104 Psychophysiologic insomnia: Secondary | ICD-10-CM | POA: Diagnosis not present

## 2017-02-25 DIAGNOSIS — F4310 Post-traumatic stress disorder, unspecified: Secondary | ICD-10-CM | POA: Diagnosis not present

## 2017-02-25 DIAGNOSIS — F418 Other specified anxiety disorders: Secondary | ICD-10-CM | POA: Diagnosis not present

## 2017-03-25 DIAGNOSIS — F4310 Post-traumatic stress disorder, unspecified: Secondary | ICD-10-CM | POA: Diagnosis not present

## 2017-03-25 DIAGNOSIS — F5104 Psychophysiologic insomnia: Secondary | ICD-10-CM | POA: Diagnosis not present

## 2017-03-25 DIAGNOSIS — F418 Other specified anxiety disorders: Secondary | ICD-10-CM | POA: Diagnosis not present

## 2017-04-25 DIAGNOSIS — F4310 Post-traumatic stress disorder, unspecified: Secondary | ICD-10-CM | POA: Diagnosis not present

## 2017-04-25 DIAGNOSIS — F418 Other specified anxiety disorders: Secondary | ICD-10-CM | POA: Diagnosis not present

## 2017-04-27 DIAGNOSIS — Z1231 Encounter for screening mammogram for malignant neoplasm of breast: Secondary | ICD-10-CM | POA: Diagnosis not present

## 2017-05-28 DIAGNOSIS — M25571 Pain in right ankle and joints of right foot: Secondary | ICD-10-CM | POA: Diagnosis not present

## 2017-05-28 DIAGNOSIS — M7989 Other specified soft tissue disorders: Secondary | ICD-10-CM | POA: Diagnosis not present

## 2017-05-28 DIAGNOSIS — S8991XA Unspecified injury of right lower leg, initial encounter: Secondary | ICD-10-CM | POA: Diagnosis not present

## 2017-05-28 DIAGNOSIS — S79911A Unspecified injury of right hip, initial encounter: Secondary | ICD-10-CM | POA: Diagnosis not present

## 2017-05-28 DIAGNOSIS — S3993XA Unspecified injury of pelvis, initial encounter: Secondary | ICD-10-CM | POA: Diagnosis not present

## 2017-05-28 DIAGNOSIS — S7001XA Contusion of right hip, initial encounter: Secondary | ICD-10-CM | POA: Diagnosis not present

## 2017-05-28 DIAGNOSIS — S93401A Sprain of unspecified ligament of right ankle, initial encounter: Secondary | ICD-10-CM | POA: Diagnosis not present

## 2017-05-28 DIAGNOSIS — M25561 Pain in right knee: Secondary | ICD-10-CM | POA: Diagnosis not present

## 2017-06-14 DIAGNOSIS — Z7189 Other specified counseling: Secondary | ICD-10-CM | POA: Diagnosis not present

## 2017-06-14 DIAGNOSIS — M6740 Ganglion, unspecified site: Secondary | ICD-10-CM | POA: Diagnosis not present

## 2017-06-14 DIAGNOSIS — Z8739 Personal history of other diseases of the musculoskeletal system and connective tissue: Secondary | ICD-10-CM | POA: Diagnosis not present

## 2017-06-14 DIAGNOSIS — E559 Vitamin D deficiency, unspecified: Secondary | ICD-10-CM | POA: Diagnosis not present

## 2017-06-14 DIAGNOSIS — K219 Gastro-esophageal reflux disease without esophagitis: Secondary | ICD-10-CM | POA: Diagnosis not present

## 2017-06-14 DIAGNOSIS — F418 Other specified anxiety disorders: Secondary | ICD-10-CM | POA: Diagnosis not present

## 2017-06-14 DIAGNOSIS — Z0000 Encounter for general adult medical examination without abnormal findings: Secondary | ICD-10-CM | POA: Diagnosis not present

## 2017-06-14 DIAGNOSIS — F4310 Post-traumatic stress disorder, unspecified: Secondary | ICD-10-CM | POA: Diagnosis not present

## 2017-06-14 DIAGNOSIS — Z79899 Other long term (current) drug therapy: Secondary | ICD-10-CM | POA: Diagnosis not present

## 2017-06-14 DIAGNOSIS — M810 Age-related osteoporosis without current pathological fracture: Secondary | ICD-10-CM | POA: Diagnosis not present

## 2017-06-14 DIAGNOSIS — H6121 Impacted cerumen, right ear: Secondary | ICD-10-CM | POA: Diagnosis not present

## 2017-06-14 DIAGNOSIS — S93411A Sprain of calcaneofibular ligament of right ankle, initial encounter: Secondary | ICD-10-CM | POA: Diagnosis not present

## 2017-06-14 DIAGNOSIS — Z23 Encounter for immunization: Secondary | ICD-10-CM | POA: Diagnosis not present

## 2017-06-16 DIAGNOSIS — Z8739 Personal history of other diseases of the musculoskeletal system and connective tissue: Secondary | ICD-10-CM | POA: Diagnosis not present

## 2017-06-16 DIAGNOSIS — M810 Age-related osteoporosis without current pathological fracture: Secondary | ICD-10-CM | POA: Diagnosis not present

## 2017-06-16 DIAGNOSIS — E559 Vitamin D deficiency, unspecified: Secondary | ICD-10-CM | POA: Diagnosis not present

## 2017-06-16 DIAGNOSIS — S93411A Sprain of calcaneofibular ligament of right ankle, initial encounter: Secondary | ICD-10-CM | POA: Diagnosis not present

## 2017-06-16 DIAGNOSIS — S8991XA Unspecified injury of right lower leg, initial encounter: Secondary | ICD-10-CM | POA: Diagnosis not present

## 2017-06-16 DIAGNOSIS — Z780 Asymptomatic menopausal state: Secondary | ICD-10-CM | POA: Diagnosis not present

## 2017-07-05 DIAGNOSIS — M19032 Primary osteoarthritis, left wrist: Secondary | ICD-10-CM | POA: Diagnosis not present

## 2017-07-05 DIAGNOSIS — M1812 Unilateral primary osteoarthritis of first carpometacarpal joint, left hand: Secondary | ICD-10-CM | POA: Diagnosis not present

## 2017-07-05 DIAGNOSIS — M25532 Pain in left wrist: Secondary | ICD-10-CM | POA: Diagnosis not present

## 2017-07-05 DIAGNOSIS — M67432 Ganglion, left wrist: Secondary | ICD-10-CM | POA: Diagnosis not present

## 2017-07-08 DIAGNOSIS — F418 Other specified anxiety disorders: Secondary | ICD-10-CM | POA: Diagnosis not present

## 2017-07-08 DIAGNOSIS — F4310 Post-traumatic stress disorder, unspecified: Secondary | ICD-10-CM | POA: Diagnosis not present

## 2017-07-08 DIAGNOSIS — F5104 Psychophysiologic insomnia: Secondary | ICD-10-CM | POA: Diagnosis not present
